# Patient Record
Sex: Male | Born: 1990 | Race: Black or African American | Hispanic: No | Marital: Single
Health system: Southern US, Community
[De-identification: ages and names within clinical notes are randomized; demographics above are authoritative.]

---

## 2019-10-14 ENCOUNTER — Emergency Department (HOSPITAL_COMMUNITY): Payer: No Typology Code available for payment source

## 2019-10-14 ENCOUNTER — Encounter (HOSPITAL_COMMUNITY): Payer: Self-pay

## 2019-10-14 ENCOUNTER — Emergency Department (HOSPITAL_COMMUNITY)
Admission: EM | Admit: 2019-10-14 | Discharge: 2019-10-14 | Disposition: A | Discharge status: Home / Self Care | Payer: No Typology Code available for payment source | Attending: Emergency Medicine | Admitting: Emergency Medicine

## 2019-10-14 DIAGNOSIS — Y999 Unspecified external cause status: Secondary | ICD-10-CM | POA: Insufficient documentation

## 2019-10-14 DIAGNOSIS — Z23 Encounter for immunization: Secondary | ICD-10-CM | POA: Diagnosis not present

## 2019-10-14 DIAGNOSIS — Y929 Unspecified place or not applicable: Secondary | ICD-10-CM | POA: Insufficient documentation

## 2019-10-14 DIAGNOSIS — S40011A Contusion of right shoulder, initial encounter: Secondary | ICD-10-CM | POA: Insufficient documentation

## 2019-10-14 DIAGNOSIS — S060X0A Concussion without loss of consciousness, initial encounter: Secondary | ICD-10-CM | POA: Diagnosis not present

## 2019-10-14 DIAGNOSIS — S2020XA Contusion of thorax, unspecified, initial encounter: Secondary | ICD-10-CM

## 2019-10-14 DIAGNOSIS — S01111A Laceration without foreign body of right eyelid and periocular area, initial encounter: Secondary | ICD-10-CM | POA: Diagnosis not present

## 2019-10-14 DIAGNOSIS — S022XXA Fracture of nasal bones, initial encounter for closed fracture: Secondary | ICD-10-CM | POA: Diagnosis not present

## 2019-10-14 DIAGNOSIS — T07XXXA Unspecified multiple injuries, initial encounter: Secondary | ICD-10-CM | POA: Diagnosis present

## 2019-10-14 DIAGNOSIS — Y939 Activity, unspecified: Secondary | ICD-10-CM | POA: Insufficient documentation

## 2019-10-14 LAB — COMPREHENSIVE METABOLIC PANEL
ALT: 13 U/L (ref 0–44)
AST: 26 U/L (ref 15–41)
Albumin: 4.3 g/dL (ref 3.5–5.0)
Alkaline Phosphatase: 62 U/L (ref 38–126)
Anion gap: 7 (ref 5–15)
BUN: 9 mg/dL (ref 6–20)
CO2: 25 mmol/L (ref 22–32)
Calcium: 9.4 mg/dL (ref 8.9–10.3)
Chloride: 104 mmol/L (ref 98–111)
Creatinine, Ser: 1.21 mg/dL (ref 0.61–1.24)
GFR calc Af Amer: >60 mL/min (ref >60)
GFR calc non Af Amer: >60 mL/min (ref >60)
Glucose, Bld: 104 mg/dL — ABNORMAL HIGH (ref 70–99)
Potassium: 3.6 mmol/L (ref 3.5–5.1)
Sodium: 136 mmol/L (ref 135–145)
Total Bilirubin: 1.3 mg/dL — ABNORMAL HIGH (ref 0.3–1.2)
Total Protein: 7.5 g/dL (ref 6.5–8.1)

## 2019-10-14 LAB — I-STAT CHEM 8, ED
BUN: 9 mg/dL (ref 6–20)
Calcium, Ion: 1.17 mmol/L (ref 1.15–1.40)
Chloride: 102 mmol/L (ref 98–111)
Creatinine, Ser: 1.1 mg/dL (ref 0.61–1.24)
Glucose, Bld: 98 mg/dL (ref 70–99)
HCT: 36 % — ABNORMAL LOW (ref 39.0–52.0)
Hemoglobin: 12.2 g/dL — ABNORMAL LOW (ref 13.0–17.0)
Potassium: 3.6 mmol/L (ref 3.5–5.1)
Sodium: 138 mmol/L (ref 135–145)
TCO2: 27 mmol/L (ref 22–32)

## 2019-10-14 LAB — CBC
HCT: 34 % — ABNORMAL LOW (ref 39.0–52.0)
Hemoglobin: 11.4 g/dL — ABNORMAL LOW (ref 13.0–17.0)
MCH: 32.6 pg (ref 26.0–34.0)
MCHC: 33.5 g/dL (ref 30.0–36.0)
MCV: 97.1 fL (ref 80.0–100.0)
Platelets: 264 10*3/uL (ref 150–400)
RBC: 3.5 MIL/uL — ABNORMAL LOW (ref 4.22–5.81)
RDW: 12.3 % (ref 11.5–15.5)
WBC: 6 10*3/uL (ref 4.0–10.5)
nRBC: 0 % (ref 0.0–0.2)

## 2019-10-14 LAB — LACTIC ACID, PLASMA: Lactic Acid, Venous: 1.5 mmol/L (ref 0.5–1.9)

## 2019-10-14 LAB — SAMPLE TO BLOOD BANK

## 2019-10-14 LAB — PROTIME-INR
INR: 1.1 (ref 0.8–1.2)
Prothrombin Time: 14.1 seconds (ref 11.4–15.2)

## 2019-10-14 LAB — CDS SEROLOGY

## 2019-10-14 LAB — ETHANOL: Alcohol, Ethyl (B): <10 mg/dL (ref <10)

## 2019-10-14 MED ORDER — TETANUS-DIPHTH-ACELL PERTUSSIS 5-2.5-18.5 LF-MCG/0.5 IM SUSP
0.5000 mL | Freq: Once | INTRAMUSCULAR | Status: AC
  Administered 2019-10-14: 0.5 mL via INTRAMUSCULAR
  Filled 2019-10-14: qty 0.5

## 2019-10-14 MED ORDER — NAPROXEN 500 MG PO TABS: 500.0000 mg | ORAL_TABLET | Freq: Two times a day (BID) | ORAL | 0 refills | Status: AC

## 2019-10-14 MED ORDER — LIDOCAINE-EPINEPHRINE (PF) 1 %-1:200000 IJ SOLN
10.0000 mL | Freq: Once | INTRAMUSCULAR | Status: AC
  Administered 2019-10-14: 10 mL
  Filled 2019-10-14: qty 10

## 2019-10-14 MED ORDER — IOHEXOL 300 MG/ML  SOLN
100.0000 mL | Freq: Once | INTRAMUSCULAR | Status: AC | PRN
  Administered 2019-10-14: 100 mL via INTRAVENOUS

## 2019-10-14 MED ORDER — LIDOCAINE-EPINEPHRINE (PF) 2 %-1:200000 IJ SOLN
INTRAMUSCULAR | Status: AC
  Filled 2019-10-14: qty 20

## 2019-10-14 NOTE — Discharge Instructions (Signed)
I have placed 5 stitches in your eyebrow to help pull together the cut, these need to be taken out between 5 and 7 days from now.  You should have this done by a medical provider who can evaluate to make sure that the wound is healing appropriately.  This will likely have some small amount of bleeding, some pain, some swelling and may even cause a black eye on the right side.  You also have a very small cut on the right cheek however this is too small for stitches so please keep a topical antibiotic ointment on both your cheek and the laceration which was repaired.    You can take naproxen twice a day as needed for pain, seek medical exam for any severe or worsening symptoms including severe headache vomiting seizures, numbness, weakness, changes in vision or speech.  You have had a concussion, please read the attached instructions about concussions.
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
## 2019-10-14 NOTE — ED Notes (Signed)
Wallet picked up by "significant other" Joni Reining, signed out by security.

## 2019-10-14 NOTE — ED Notes (Signed)
Pt transported to xray 

## 2019-10-14 NOTE — Progress Notes (Signed)
Orthopedic Tech Progress Note Patient Details:  Edgar Vargas 08-Oct-1990 [id number]  Patient ID: [name], male   DOB: 17-Sep-1990, 29 y.o.   MRN: [id number]   Saul Fordyce 10/14/2019, 1:39 PMLEVEL 2 TRAUMA
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
## 2019-10-14 NOTE — ED Notes (Signed)
Family updated.
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
